# Patient Record
Sex: Female | Born: 1970 | Race: White | Hispanic: No | Marital: Married | Smoking: Never smoker
Health system: Southern US, Community
[De-identification: ages and names within clinical notes are randomized; demographics above are authoritative.]

## PROBLEM LIST (undated history)

## (undated) DIAGNOSIS — G43909 Migraine, unspecified, not intractable, without status migrainosus: Secondary | ICD-10-CM

## (undated) DIAGNOSIS — B009 Herpesviral infection, unspecified: Secondary | ICD-10-CM

## (undated) HISTORY — PX: KNEE ARTHROSCOPY W/ ACL RECONSTRUCTION: SHX1858

---

## 2007-01-18 ENCOUNTER — Ambulatory Visit: Payer: Self-pay | Admitting: Emergency Medicine

## 2007-01-19 ENCOUNTER — Ambulatory Visit: Payer: Self-pay | Admitting: Emergency Medicine

## 2012-01-06 ENCOUNTER — Ambulatory Visit: Payer: Self-pay

## 2012-07-23 ENCOUNTER — Ambulatory Visit: Payer: Self-pay | Admitting: Family Medicine

## 2012-08-15 ENCOUNTER — Ambulatory Visit: Payer: Self-pay | Admitting: Family Medicine

## 2016-10-26 ENCOUNTER — Ambulatory Visit (INDEPENDENT_AMBULATORY_CARE_PROVIDER_SITE_OTHER): Payer: No Typology Code available for payment source

## 2016-10-26 ENCOUNTER — Ambulatory Visit
Admission: EM | Admit: 2016-10-26 | Discharge: 2016-10-26 | Disposition: A | Discharge status: Home / Self Care | Payer: No Typology Code available for payment source | Attending: Family Medicine | Admitting: Family Medicine

## 2016-10-26 DIAGNOSIS — S62651A Nondisplaced fracture of medial phalanx of left index finger, initial encounter for closed fracture: Secondary | ICD-10-CM

## 2016-10-26 DIAGNOSIS — S62641A Nondisplaced fracture of proximal phalanx of left index finger, initial encounter for closed fracture: Secondary | ICD-10-CM | POA: Diagnosis not present

## 2016-10-26 NOTE — ED Triage Notes (Signed)
Pt c/o left index finger injury, she was having a wrestling match with her family and afterwards it started hurting, swelling, restricted movements.

## 2016-10-26 NOTE — ED Provider Notes (Signed)
CSN: [id number]     Arrival date & time 10/26/16  1113 History   First MD Initiated Contact with Patient 10/26/16 1201     Chief Complaint  Patient presents with  . Finger Injury    left first finger   (Consider location/radiation/quality/duration/timing/severity/associated sxs/prior Treatment) HPI  This a 46 year old female who presents with an injury to her left nondominant index finger. She states that she and her family were wrestling on the floor playfully and did not remember specific injury to her hand but afterwards started noticing pain over her PIP joint of her left index finger. There was swelling more today and more painful along with restricted movement so she decided to come here.        History reviewed. No pertinent past medical history. Past Surgical History:  Procedure Laterality Date  . KNEE ARTHROSCOPY W/ ACL RECONSTRUCTION Left    History reviewed. No pertinent family history. Social History  Substance Use Topics  . Smoking status: Never Smoker  . Smokeless tobacco: Never Used  . Alcohol use Yes   OB History    No data available     Review of Systems  Constitutional: Positive for activity change. Negative for chills, fatigue and fever.  Musculoskeletal: Positive for arthralgias and joint swelling.  All other systems reviewed and are negative.   Allergies  Morphine and related  Home Medications   Prior to Admission medications   Not on File   Meds Ordered and Administered this Visit  Medications - No data to display  BP (!) 169/88 (BP Location: Right Arm)   Pulse 95   Temp 98 F (36.7 C) (Oral)   Resp 18   Ht  (1.676 m)   Wt 200 lb (90.7 kg)   LMP 10/11/2016 (Within Days) Comment: denies preg  SpO2 98%   BMI 32.28 kg/m  No data found.   Physical Exam  Constitutional: She is oriented to person, place, and time. She appears well-developed and well-nourished. No distress.  HENT:  Head: Normocephalic and atraumatic.  Eyes:  Pupils are equal, round, and reactive to light.  Neck: Normal range of motion.  Musculoskeletal: She exhibits edema and tenderness. She exhibits no deformity.  Examination of the left nondominant index finger shows swelling of the PIP and DIP joints. Patient has full extension but is only able to flex it to approximately 95. Collateral ligaments are strong and intact. FDS and FDP are strong with good pull-through. There is no crepitus or induration present. Maximal tenderness is to bilateral compression of the PIP joint. Is less so at the DIP joint. There is no tenderness over the phalanges.  Neurological: She is alert and oriented to person, place, and time.  Skin: Skin is warm and dry. She is not diaphoretic.  Psychiatric: She has a normal mood and affect. Her behavior is normal. Judgment and thought content normal.  Nursing note and vitals reviewed.   Urgent Care Course     Procedures (including critical care time)  Labs Review Labs Reviewed - No data to display  Imaging Review Dg Finger Index Left  Result Date: 10/26/2016 CLINICAL DATA:  Pt states the family were play wrestling 2 days ago and she realized her finger was hurting afterwards. Most pain in DIP but PIP joint more of left index finger EXAM: LEFT INDEX FINGER 2+V COMPARISON:  None. FINDINGS: There is a nondisplaced fracture along the volar base of the second middle phalanx. There is no evidence of arthropathy or other focal  bone abnormality. Soft tissues are unremarkable. IMPRESSION: 1. Nondisplaced fracture along the volar base of the second middle phalanx. Electronically Signed   By: Elige Ko   On: 10/26/2016 12:30     Visual Acuity Review  Right Eye Distance:   Left Eye Distance:   Bilateral Distance:    Right Eye Near:   Left Eye Near:    Bilateral Near:     Patient was given a dorsal aluminum splint with buddy taping index to middle finger    MDM   1. Closed nondisplaced fracture of middle phalanx of  left index finger, initial encounter    The patient wearr the splint for activities and quiet times may come out of the splint. I will start her on early range of motion once the swelling has subsided. She will use the splint for 2-4 weeks. If she has any difficulties have given her the name and number of orthopedic group that she may contact. Use Motrin for pain. She will elevate her hand above her heart most of the day as necessary for any swelling. I've encouraged her to use ice 20 minutes out of every 2 hours 3-4 times a day.   Lutricia Feil, PA-C 10/26/16 1312

## 2017-07-26 IMAGING — CR DG FINGER INDEX 2+V*L*
3 series · 3 of 3 positions shown · non-contrast
Comparison: None.

CLINICAL DATA: Pt states the family were play wrestling 2 days ago
and she realized her finger was hurting afterwards. Most pain in DIP
but PIP joint more of left index finger

EXAM:
LEFT INDEX FINGER 2+V

[finger ap]
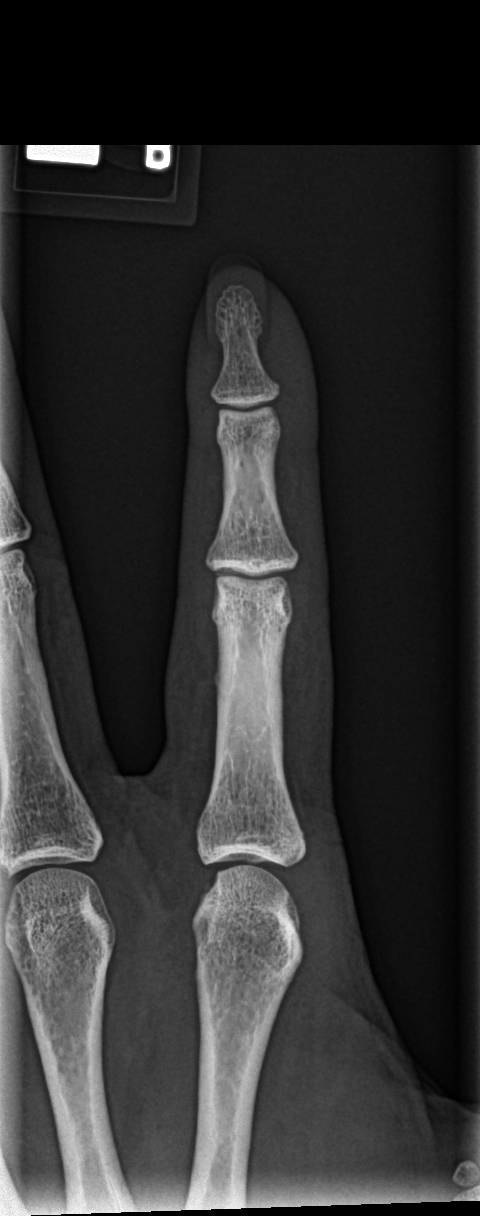

[finger obl]
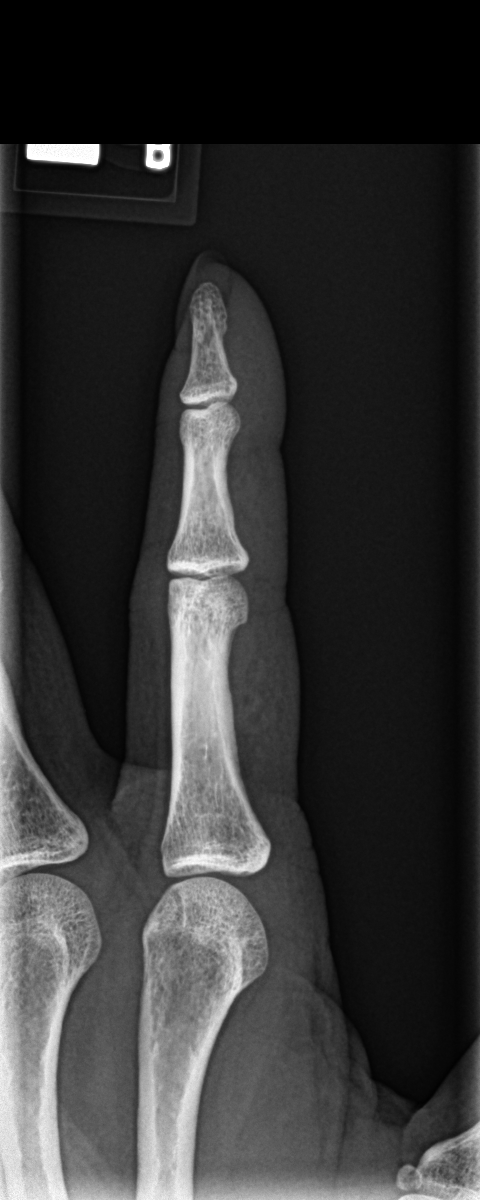

[finger lat]
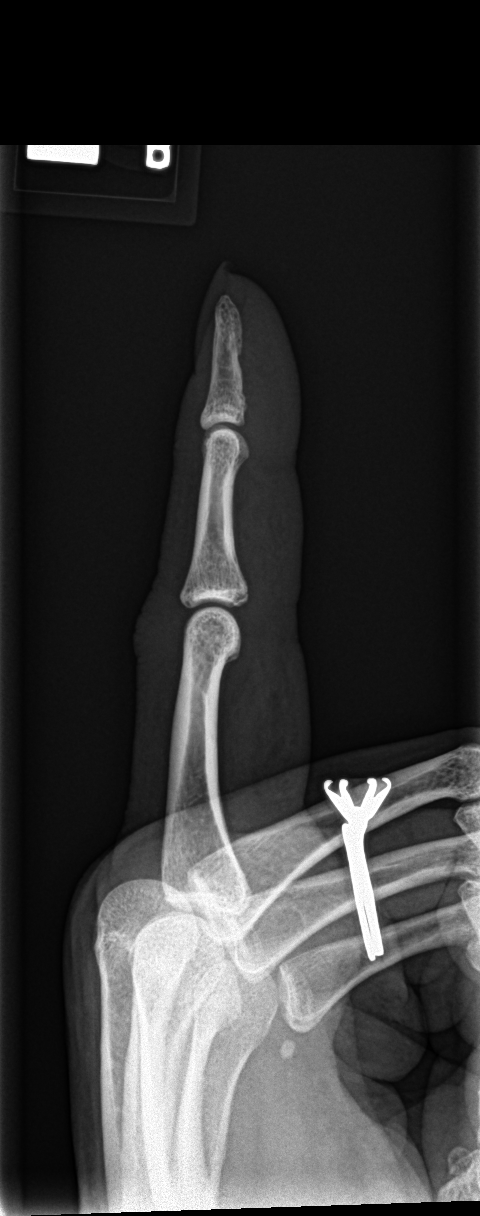

[3 of 3 positions shown; findings below may reference images not displayed]

FINDINGS: There is a nondisplaced fracture along the volar base of the second
middle phalanx. There is no evidence of arthropathy or other focal
bone abnormality. Soft tissues are unremarkable.
IMPRESSION: 1. Nondisplaced fracture along the volar base of the second middle
phalanx.

## 2019-01-15 ENCOUNTER — Ambulatory Visit
Admission: EM | Admit: 2019-01-15 | Discharge: 2019-01-15 | Disposition: A | Discharge status: Home / Self Care | Payer: BLUE CROSS/BLUE SHIELD

## 2019-01-15 ENCOUNTER — Ambulatory Visit (INDEPENDENT_AMBULATORY_CARE_PROVIDER_SITE_OTHER): Payer: BLUE CROSS/BLUE SHIELD

## 2019-01-15 ENCOUNTER — Other Ambulatory Visit: Payer: Self-pay

## 2019-01-15 DIAGNOSIS — S62646A Nondisplaced fracture of proximal phalanx of right little finger, initial encounter for closed fracture: Secondary | ICD-10-CM | POA: Diagnosis not present

## 2019-01-15 DIAGNOSIS — W0110XA Fall on same level from slipping, tripping and stumbling with subsequent striking against unspecified object, initial encounter: Secondary | ICD-10-CM | POA: Diagnosis not present

## 2019-01-15 DIAGNOSIS — M79644 Pain in right finger(s): Secondary | ICD-10-CM

## 2019-01-15 HISTORY — DX: Migraine, unspecified, not intractable, without status migrainosus: G43.909

## 2019-01-15 HISTORY — DX: Herpesviral infection, unspecified: B00.9

## 2019-01-15 NOTE — Discharge Instructions (Addendum)
Rest.  Buddy tape.  Use finger splint when active.  Follow-up in 2 weeks with orthopedic as needed, as discussed.  Follow up with your primary care physician this week as needed. Return to Urgent care for new or worsening concerns.

## 2019-01-15 NOTE — ED Notes (Signed)
4th and 5th fingers buddy taped on right hand. Splint given "to go"

## 2019-01-15 NOTE — ED Provider Notes (Signed)
[facility] ____________________________________________  Time seen: Approximately 5:09 PM  I have reviewed the triage vital signs and the nursing notes.   HISTORY  Chief Complaint Hand Pain   HPI Gloria Oneill is a 48 y.o. female presenting for evaluation of right pinky pain post injury that occurred on July 3.  Patient reports that she tripped and fell hitting her right fifth digit on concrete.  States pain initially.  States she does have better range of motion now than she did, but coming in today for evaluation as pain has continued.  States does have good range of motion now but initially did not.  States she had a scab from hitting the concrete which is healing.  Denies any other pain to right hand.  States she is ambidextrous.  Denies any other injuries.  No alleviating measures.  States pain is mostly with bending or bumping the area.  Denies recent cough, fevers or sickness.   Past Medical History:  Diagnosis Date  . Herpes simplex type 1 infection   . Migraines     There are no active problems to display for this patient.   Past Surgical History:  Procedure Laterality Date  . KNEE ARTHROSCOPY W/ ACL RECONSTRUCTION Left      No current facility-administered medications for this encounter.   Current Outpatient Medications:  .  SUMAtriptan (IMITREX) 50 MG tablet, TAKE 1 TABLET BY MOUTH AT ONSET HEADACHE MAY REPEAT X 1 PER 24 HOURS, Disp: , Rfl:   Allergies Oxycodone and Morphine and related  History reviewed. No pertinent family history.  Social History Social History   Tobacco Use  . Smoking status: Never Smoker  . Smokeless tobacco: Never Used  Substance Use Topics  . Alcohol use: Yes    Comment: social  . Drug use: No    Review of Systems Constitutional: No fever ENT: No sore throat. Cardiovascular: Denies chest pain. Respiratory: Denies shortness of breath. Gastrointestinal: No abdominal pain.   Genitourinary: Negative for dysuria.  Musculoskeletal: Positive right hand pain. Skin: Negative for rash.   ____________________________________________   PHYSICAL EXAM:  VITAL SIGNS: ED Triage Vitals  Enc Vitals Group     BP 01/15/19 1657 (!) 145/73     Pulse Rate 01/15/19 1657 98     Resp 01/15/19 1657 15     Temp 01/15/19 1657 98.2 F (36.8 C)     Temp Source 01/15/19 1657 Oral     SpO2 01/15/19 1657 97 %     Weight 01/15/19 1653 212 lb (96.2 kg)     Height 01/15/19 1653 5\' 6"  (1.676 m)     Head Circumference --      Peak Flow --      Pain Score 01/15/19 1653 4     Pain Loc --      Pain Edu? --      Excl. in [place]? --     Constitutional: Alert and oriented. Well appearing and in no acute distress. ENT      Head: Normocephalic and atraumatic. Cardiovascular: Normal rate, regular rhythm. Grossly normal heart sounds.  Good peripheral circulation. Respiratory: Normal respiratory effort without tachypnea nor retractions. Breath sounds are clear and equal bilaterally. No wheezes, rales, rhonchi. Musculoskeletal: Steady gait.  Bilateral distal radial pulses equal and easily palpated. Except: Right fifth digit proximal phalanx and MCP joint mild tenderness to direct palpation with healing palmar scab, normal distal sensation and capillary refill to all right hand distal fingers, right fifth digit good resisted  flexion and extension but with pain with resisted flexion, full range of motion present to right fifth digit. Neurologic:  Normal speech and language.Speech is normal. No gait instability.  Skin:  Skin is warm, dry Psychiatric: Mood and affect are normal. Speech and behavior are normal. Patient exhibits appropriate insight and judgment   ___________________________________________   LABS (all labs ordered are listed, but only abnormal results are displayed)  Labs Reviewed - No data to display  RADIOLOGY  Dg Finger Little Right  Result Date: 01/15/2019 CLINICAL DATA:  Fall on July 3.  Continued pain and  swelling. EXAM: RIGHT LITTLE FINGER 2+V COMPARISON:  None. FINDINGS: There appears to be a nondisplaced fracture through the base of the right little finger proximal phalanx best seen on the lateral view. No subluxation or dislocation. Joint spaces maintained. IMPRESSION: Nondisplaced fracture through the base of the right little finger proximal phalanx. Electronically Signed   By: Charlett NoseKevin  Dover M.D.   On: 01/15/2019 17:53   ____________________________________________   PROCEDURES Procedures   INITIAL IMPRESSION / ASSESSMENT AND PLAN / ED COURSE  Pertinent labs & imaging results that were available during my care of the patient were reviewed by me and considered in my medical decision making (see chart for details).  Well-appearing patient.  No acute distress.  Right fifth digit pain post mechanical injury.  Right fifth digit x-ray as above.  Right fifth digit nondisplaced fracture through the base of the right little finger proximal phalanx.  As this occurred almost 2 weeks ago buddy taping applied and finger splint given for patient when she is going to be active with her hand.  Rest.  Supportive care.  Follow-up with orthopedic in 2 week for continued complaints.  Discussed follow up with Primary care physician this week. Discussed follow up and return parameters including no resolution or any worsening concerns. Patient verbalized understanding and agreed to plan.   ____________________________________________   FINAL CLINICAL IMPRESSION(S) / ED DIAGNOSES  Final diagnoses:  Closed nondisplaced fracture of proximal phalanx of right little finger, initial encounter     ED Discharge Orders    None       Note: This dictation was prepared with Dragon dictation along with smaller phrase technology. Any transcriptional errors that result from this process are unintentional.         Renford DillsMiller, Getsemani Lindon, NP 01/15/19 1931

## 2019-01-15 NOTE — ED Triage Notes (Signed)
Pt fell on July 3rd and hit pinky and outside of right hand. Still with pain and swelling to the area. Pain 4/10.

## 2019-10-15 IMAGING — CR RIGHT LITTLE FINGER 2+V
3 series · 3 of 3 positions shown · non-contrast
Comparison: None.

CLINICAL DATA: Fall on [DATE].  Continued pain and swelling.

EXAM:
RIGHT LITTLE FINGER 2+V

[finger ap]
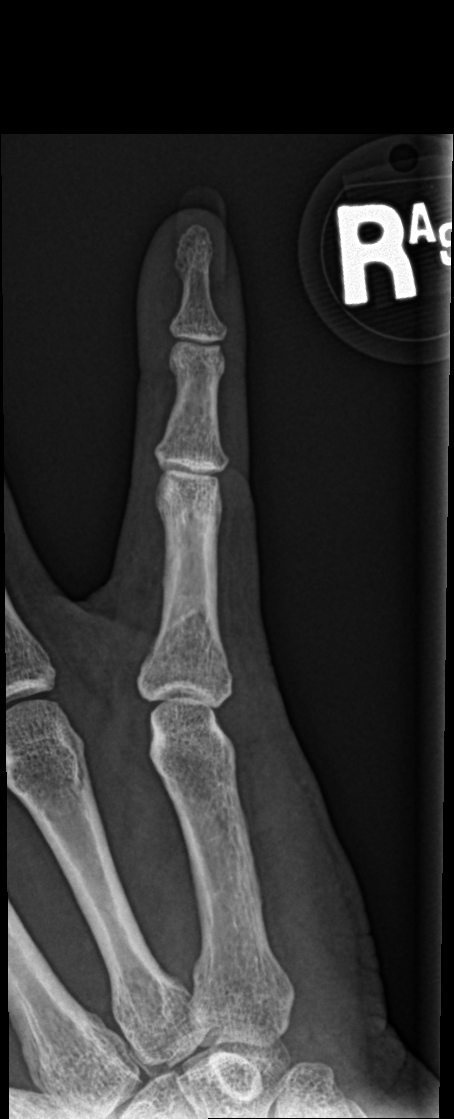

[finger obl]
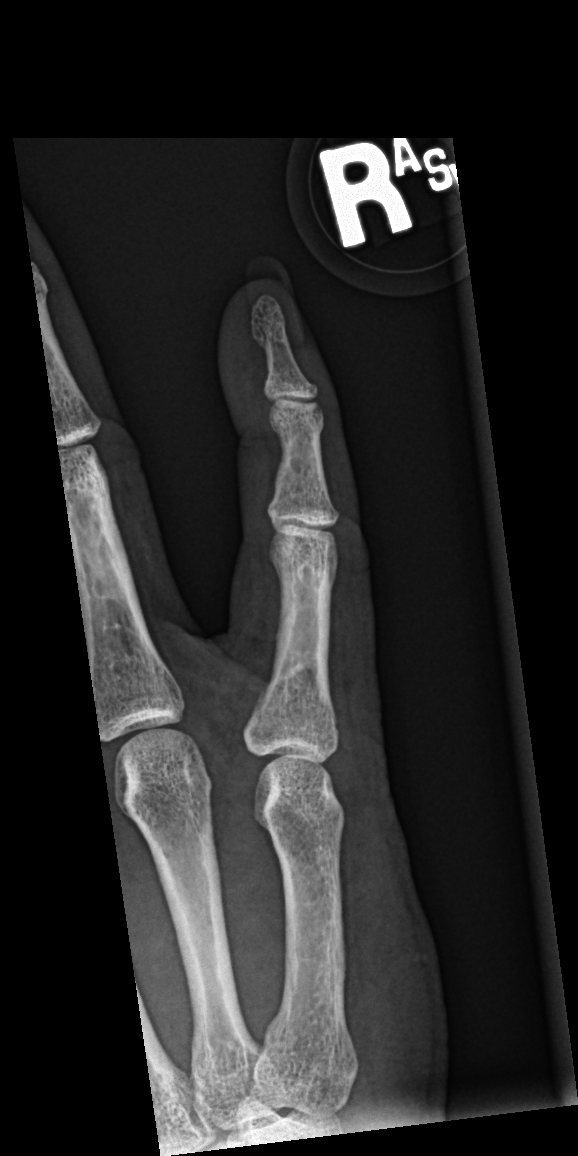

[finger lat]
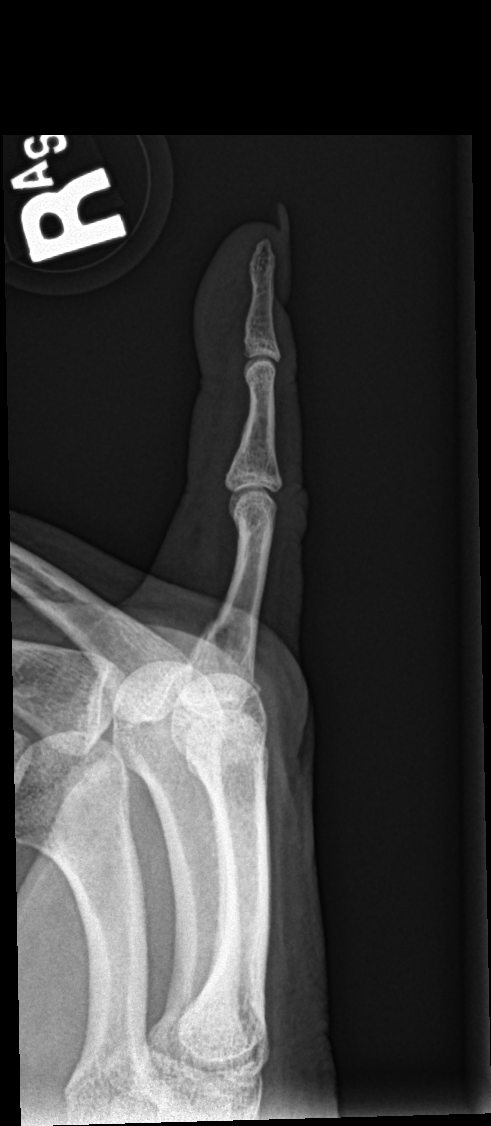

[3 of 3 positions shown; findings below may reference images not displayed]

FINDINGS: There appears to be a nondisplaced fracture through the base of the
right little finger proximal phalanx best seen on the lateral view.
No subluxation or dislocation. Joint spaces maintained.
IMPRESSION: Nondisplaced fracture through the base of the right little finger
proximal phalanx.
